# Patient Record
Sex: Male | Born: 1995 | Race: Black or African American | Hispanic: No | Marital: Single | Smoking: Current some day smoker
Health system: Southern US, Community
[De-identification: ages and names within clinical notes are randomized; demographics above are authoritative.]

## PROBLEM LIST (undated history)

## (undated) DIAGNOSIS — F32A Depression, unspecified: Secondary | ICD-10-CM

## (undated) DIAGNOSIS — F909 Attention-deficit hyperactivity disorder, unspecified type: Secondary | ICD-10-CM

---

## 2015-09-22 ENCOUNTER — Emergency Department
Admission: EM | Admit: 2015-09-22 | Discharge: 2015-09-22 | Disposition: A | Discharge status: Home / Self Care | Payer: Self-pay | Attending: Emergency Medicine | Admitting: Emergency Medicine

## 2015-09-22 ENCOUNTER — Encounter: Payer: Self-pay | Admitting: Emergency Medicine

## 2015-09-22 ENCOUNTER — Emergency Department: Payer: Self-pay

## 2015-09-22 DIAGNOSIS — S82402A Unspecified fracture of shaft of left fibula, initial encounter for closed fracture: Secondary | ICD-10-CM

## 2015-09-22 DIAGNOSIS — Y929 Unspecified place or not applicable: Secondary | ICD-10-CM | POA: Insufficient documentation

## 2015-09-22 DIAGNOSIS — F17200 Nicotine dependence, unspecified, uncomplicated: Secondary | ICD-10-CM | POA: Insufficient documentation

## 2015-09-22 DIAGNOSIS — Y9372 Activity, wrestling: Secondary | ICD-10-CM | POA: Insufficient documentation

## 2015-09-22 DIAGNOSIS — W51XXXA Accidental striking against or bumped into by another person, initial encounter: Secondary | ICD-10-CM | POA: Insufficient documentation

## 2015-09-22 DIAGNOSIS — Y999 Unspecified external cause status: Secondary | ICD-10-CM | POA: Insufficient documentation

## 2015-09-22 DIAGNOSIS — S82401A Unspecified fracture of shaft of right fibula, initial encounter for closed fracture: Secondary | ICD-10-CM | POA: Insufficient documentation

## 2015-09-22 MED ORDER — HYDROCODONE-ACETAMINOPHEN 5-325 MG PO TABS: 1.0000 | ORAL_TABLET | ORAL | 0 refills | Status: DC | PRN

## 2015-09-22 MED ORDER — OXYCODONE-ACETAMINOPHEN 5-325 MG PO TABS
1.0000 | ORAL_TABLET | Freq: Once | ORAL | Status: AC
  Administered 2015-09-22: 1 via ORAL
  Filled 2015-09-22: qty 1

## 2015-09-22 MED ORDER — NAPROXEN 500 MG PO TABS: 500.0000 mg | ORAL_TABLET | Freq: Two times a day (BID) | ORAL | 0 refills | Status: AC

## 2015-09-22 NOTE — ED Provider Notes (Signed)
[facility] Emergency Department Provider Note   ____________________________________________   First MD Initiated Contact with Patient 09/22/15 1219     (approximate)  I have reviewed the triage vital signs and the nursing notes.   HISTORY  Chief Complaint Leg Injury    HPI Steven Brock is a 20 y.o. male presents for evaluation of right knee pain. Patient states he was wrestling around with another friend when he landed on his knee. Complains of pain laterally to the right aspect of his leg.   History reviewed. No pertinent past medical history.  There are no active problems to display for this patient.   History reviewed. No pertinent surgical history.  Prior to Admission medications   Medication Sig Start Date End Date Taking? Authorizing Provider  HYDROcodone-acetaminophen (NORCO) 5-325 MG tablet Take 1-2 tablets by mouth every 4 (four) hours as needed for moderate pain. 09/22/15   Evangeline Dakin, PA-C  naproxen (NAPROSYN) 500 MG tablet Take 1 tablet (500 mg total) by mouth 2 (two) times daily with a meal. 09/22/15   Evangeline Dakin, PA-C    Allergies Bee venom  No family history on file.  Social History Social History  Substance Use Topics  . Smoking status: Current Some Day Smoker  . Smokeless tobacco: Never Used  . Alcohol use No     Comment: Occassionally    Review of Systems Constitutional: No fever/chills Cardiovascular: Denies chest pain. Respiratory: Denies shortness of breath. Musculoskeletal: Positive for right knee pain. Skin: Negative for rash. Neurological: Negative for headaches, focal weakness or numbness.  10-point ROS otherwise negative.  ____________________________________________   PHYSICAL EXAM:  VITAL SIGNS: ED Triage Vitals  Enc Vitals Group     BP 09/22/15 1124 124/75     Pulse Rate 09/22/15 1124 63     Resp 09/22/15 1124 18     Temp 09/22/15 1124 97.5 F (36.4 C)     Temp Source 09/22/15  1124 Oral     SpO2 09/22/15 1124 100 %     Weight 09/22/15 1125 115 lb (52.2 kg)     Height 09/22/15 1125 5\' 5"  (1.651 m)     Head Circumference --      Peak Flow --      Pain Score 09/22/15 1125 6     Pain Loc --      Pain Edu? --      Excl. in GC? --     Constitutional: Alert and oriented. Well appearing and in no acute distress.   Cardiovascular: Normal rate, regular rhythm. Grossly normal heart sounds.  Good peripheral circulation. Respiratory: Normal respiratory effort.  No retractions. Lungs CTAB. Musculoskeletal: Positive right knee tenderness lateral aspect. Positive effusion noted to the right knee. Limited range of motion increased pain with extension and flexion and point tenderness to the lateral aspect of the knee. Neurologic:  Normal speech and language. No gross focal neurologic deficits are appreciated. No gait instability. Skin:  Skin is warm, dry and intact. No rash noted. Psychiatric: Mood and affect are normal. Speech and behavior are normal.  ____________________________________________   LABS (all labs ordered are listed, but only abnormal results are displayed)  Labs Reviewed - No data to display ____________________________________________  EKG   ____________________________________________  RADIOLOGY  FINDINGS:  There is an approximately 1 cm fracture fragment in the lateral  right knee approximately 1.7 cm superior to the right fibular head  with surrounding soft tissue swelling, favor an avulsion fracture  fragment from the right fibular head. No additional fracture. No  right knee joint effusion. No right knee dislocation. No suspicious  focal osseous lesion. No radiopaque foreign body.    IMPRESSION:  Avulsion fracture fragment in the lateral right knee as described,  probably arising from the right fibular head.    ____________________________________________   PROCEDURES  Procedure(s) performed: None  Procedures  Critical Care  performed: No    ____________________________________________   INITIAL IMPRESSION / ASSESSMENT AND PLAN / ED COURSE  Pertinent labs & imaging results that were available during my care of the patient were reviewed by me and considered in my medical decision making (see chart for details).   Lateral fibular head fracture. Patient notified placed in a knee immobilizer and crutches no weight bearing until follow-up with orthopedics next week. Rx given for Vicodin 5/325 as needed for pain.  Clinical Course     ____________________________________________   FINAL CLINICAL IMPRESSION(S) / ED DIAGNOSES  Final diagnoses:  Closed left fibular fracture, initial encounter      NEW MEDICATIONS STARTED DURING THIS VISIT:  Discharge Medication List as of 09/22/2015 12:48 PM    START taking these medications   Details  HYDROcodone-acetaminophen (NORCO) 5-325 MG tablet Take 1-2 tablets by mouth every 4 (four) hours as needed for moderate pain., Starting Thu 09/22/2015, Print    naproxen (NAPROSYN) 500 MG tablet Take 1 tablet (500 mg total) by mouth 2 (two) times daily with a meal., Starting Thu 09/22/2015, Print         Note:  This document was prepared using Dragon voice recognition software and may include unintentional dictation errors.   Evangeline Dakinharles M Krithika Tome, PA-C 09/22/15 1555    Myrna Blazeravid Matthew Schaevitz, MD 09/24/15 [phone]

## 2015-09-22 NOTE — ED Triage Notes (Signed)
Pt states was "horseplaying" when another guy fell onto his R leg. Pt presents with some swelling distal to his R knee. Pt maintains ability to move his R foot at this time.

## 2015-09-22 NOTE — ED Notes (Signed)
Pt in via triage with complaints of RLE pain since last night.  Pt reports "wrestling w/ my homeboy, when I picked him up, he came down on my leg."  Pt describes it as if weight of other individual landed on right knee laterally.  Pt to room via wheelchair, reports pain to severe to bear weight.  Pt A/Ox4, no immediate distress noted at this time.

## 2015-09-22 NOTE — Discharge Instructions (Signed)
Do not bear any weight until cleared by orthopedics next week. Call the above orthopedic doctor tomorrow or today and schedule appointment for next week.

## 2019-03-11 ENCOUNTER — Encounter: Payer: Self-pay | Admitting: Emergency Medicine

## 2019-03-11 ENCOUNTER — Other Ambulatory Visit: Payer: Self-pay

## 2019-03-11 ENCOUNTER — Emergency Department
Admission: EM | Admit: 2019-03-11 | Discharge: 2019-03-12 | Disposition: A | Discharge status: Other Acute Inpatient Hospital | Payer: Self-pay | Attending: Emergency Medicine | Admitting: Emergency Medicine

## 2019-03-11 DIAGNOSIS — Z046 Encounter for general psychiatric examination, requested by authority: Secondary | ICD-10-CM | POA: Insufficient documentation

## 2019-03-11 DIAGNOSIS — F203 Undifferentiated schizophrenia: Secondary | ICD-10-CM

## 2019-03-11 DIAGNOSIS — F29 Unspecified psychosis not due to a substance or known physiological condition: Secondary | ICD-10-CM | POA: Insufficient documentation

## 2019-03-11 DIAGNOSIS — F17200 Nicotine dependence, unspecified, uncomplicated: Secondary | ICD-10-CM | POA: Insufficient documentation

## 2019-03-11 DIAGNOSIS — Z20822 Contact with and (suspected) exposure to covid-19: Secondary | ICD-10-CM | POA: Insufficient documentation

## 2019-03-11 DIAGNOSIS — Z79899 Other long term (current) drug therapy: Secondary | ICD-10-CM | POA: Insufficient documentation

## 2019-03-11 DIAGNOSIS — F28 Other psychotic disorder not due to a substance or known physiological condition: Secondary | ICD-10-CM | POA: Insufficient documentation

## 2019-03-11 LAB — CBC
HCT: 43.6 % (ref 39.0–52.0)
Hemoglobin: 15.2 g/dL (ref 13.0–17.0)
MCH: 29.9 pg (ref 26.0–34.0)
MCHC: 34.9 g/dL (ref 30.0–36.0)
MCV: 85.7 fL (ref 80.0–100.0)
Platelets: 276 10*3/uL (ref 150–400)
RBC: 5.09 MIL/uL (ref 4.22–5.81)
RDW: 12.3 % (ref 11.5–15.5)
WBC: 7.3 10*3/uL (ref 4.0–10.5)
nRBC: 0 % (ref 0.0–0.2)

## 2019-03-11 LAB — COMPREHENSIVE METABOLIC PANEL
ALT: 13 U/L (ref 0–44)
AST: 18 U/L (ref 15–41)
Albumin: 4.6 g/dL (ref 3.5–5.0)
Alkaline Phosphatase: 95 U/L (ref 38–126)
Anion gap: 8 (ref 5–15)
BUN: 18 mg/dL (ref 6–20)
CO2: 29 mmol/L (ref 22–32)
Calcium: 9.4 mg/dL (ref 8.9–10.3)
Chloride: 102 mmol/L (ref 98–111)
Creatinine, Ser: 0.96 mg/dL (ref 0.61–1.24)
GFR calc Af Amer: >60 mL/min (ref >60)
GFR calc non Af Amer: >60 mL/min (ref >60)
Glucose, Bld: 101 mg/dL — ABNORMAL HIGH (ref 70–99)
Potassium: 4.2 mmol/L (ref 3.5–5.1)
Sodium: 139 mmol/L (ref 135–145)
Total Bilirubin: 1.1 mg/dL (ref 0.3–1.2)
Total Protein: 8.1 g/dL (ref 6.5–8.1)

## 2019-03-11 LAB — URINE DRUG SCREEN, QUALITATIVE (ARMC ONLY)
Amphetamines, Ur Screen: NOT DETECTED
Barbiturates, Ur Screen: NOT DETECTED
Benzodiazepine, Ur Scrn: NOT DETECTED
Cannabinoid 50 Ng, Ur ~~LOC~~: POSITIVE — AB
Cocaine Metabolite,Ur ~~LOC~~: NOT DETECTED
MDMA (Ecstasy)Ur Screen: NOT DETECTED
Methadone Scn, Ur: NOT DETECTED
Opiate, Ur Screen: NOT DETECTED
Phencyclidine (PCP) Ur S: NOT DETECTED
Tricyclic, Ur Screen: NOT DETECTED

## 2019-03-11 LAB — SALICYLATE LEVEL: Salicylate Lvl: <7 mg/dL — ABNORMAL LOW (ref 7.0–30.0)

## 2019-03-11 LAB — ACETAMINOPHEN LEVEL: Acetaminophen (Tylenol), Serum: <10 ug/mL — ABNORMAL LOW (ref 10–30)

## 2019-03-11 LAB — ETHANOL: Alcohol, Ethyl (B): <10 mg/dL (ref <10)

## 2019-03-11 MED ORDER — LORAZEPAM 1 MG PO TABS
1.0000 mg | ORAL_TABLET | Freq: Once | ORAL | Status: AC
  Administered 2019-03-11: 1 mg via ORAL
  Filled 2019-03-11: qty 1

## 2019-03-11 NOTE — ED Notes (Signed)
Patient came from [facility] under IVC with [employer]. 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 Patient denies SI/HI/AVH. Patient states the police surrounded him with AR15 and he dont know what happened. [employer] states that patient had made comments that he would shoot up the place at a COVID 19 testing place and [employer] was called due to threats.

## 2019-03-11 NOTE — ED Notes (Signed)
Pt was given a cup of ginger ale with ice, no lid or straw given.

## 2019-03-11 NOTE — ED Notes (Signed)

## 2019-03-11 NOTE — ED Notes (Signed)
Pt. Introduced to unit.  Pt. Calm and cooperative.  Pt. Advised of cameras, safety checks and bathroom usage.  Pt. Requested and was given drink.  Pt. Has no questions or concerns at this time, but pt. Agreed to come to this nurse for any information.

## 2019-03-11 NOTE — ED Notes (Signed)
Pt dressed out in wine colored scrubs. Pt belonging: neck gater, two shirts, jogging pants, sneakers, socks, boxer shorts, wallet, bag of pretzels, and small water bottle.

## 2019-03-11 NOTE — BH Assessment (Signed)
Referral information for Psychiatric Hospitalization faxed to;   [facility] [phone]),   . [facility] [phone]),   . [facility] [phone] -or- [phone]),   . [facility] [phone])  . [facility] [phone])

## 2019-03-11 NOTE — BH Assessment (Signed)
Assessment Note  Steven Brock is an 24 y.o. male presenting to [facility] ED under IVC given by [employer] police. Per [employer] police patient had made violent threats, to get a riffle and start shooting, patient had made the comments while in his backyard of his home that was near a vaccine clinic. Officers reported that patient was expressing getting calls from the government and that government was watching him. [place] police then transported patient to [facility] for further evaluation. Per [facility] clinician, patient's mother Steven Brock [phone]) was contacted. Per patient's mother patient's behaviors have been happening for a few days, mother reported that patient had expressed that everyone is out to get him, the FBI is watching him, the FBI is following him and has been screaming at night. Mother reported that that patient indicated that someone wrote a statement that he killed someone and also reported that patient went towards a vaccine clinic and said that he was going to shoot the place up with a AK 47 because they are spying on him.   During patient's assessment with psychiatric team tonight patient was alert and oriented X4, pleasant and cooperative. When asked why patient was presenting to ED patient reported "I don't know, I just had a long day today." When asked by the Psyc NP if patient had made any threats to anyone today patient was unaware "I don't know what happened." When Psyc NP tried to educate patient on mental health and things he may have experienced patient reported "I don't know the meaning of mental health." Patient was unable to report if he has been having AH or VH. Patient was able to deny current SI and reported a past suicide attempt "2-3 years ago, I didn't go to the hospital though." Patient then reported "people wrote fake statements on me, I just want to get justice for it." Patient currently lives with his mother and 2 siblings and reported his sleep as "my brain does that, I can't  control it." Patient was able to report that he smokes marijuana but was unable to recall the last time he used "I don't know, before all this shit happened." UDS was positive for Cannabinoids.   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 No pertinent past medical history.  History reviewed. No pertinent surgical history.  Family History: No family history on file.  Social History:  reports that he has been smoking. He has never used smokeless tobacco. He reports current drug use. Drug: Marijuana. He reports that he does not drink alcohol.  Additional Social History:  Alcohol / Drug Use Pain Medications: See MAR Prescriptions: See MAR Over the Counter: See MAR History of alcohol / drug use?: Yes Substance #1 Name of Substance 1: Marijuana  CIWA: CIWA-Ar BP: (!) 127/95 Pulse Rate: 76 COWS:    Allergies:  Allergies  Allergen Reactions  . Bee Venom Anaphylaxis    Home Medications: (Not in a hospital admission)   OB/GYN Status:  No LMP for male patient.  General Assessment Data Location of Assessment: [facility] ED TTS Assessment: In system Is this a Tele or Face-to-Face Assessment?: Face-to-Face Is this an Initial Assessment or a Re-assessment for this encounter?: Initial Assessment Patient Accompanied by:: N/A Language Other than English: No Living Arrangements: Other (Comment)(Private Residence) What gender do you identify as?: Male Marital status: Single Living Arrangements: Parent, Other relatives Can pt return to current living  arrangement?: (Unknown at this time) Admission Status: Involuntary Petitioner: Police Is patient capable of signing voluntary admission?: No Referral Source: Other Insurance type: None  Medical Screening Exam [facility] Walk-in ONLY) Medical Exam completed: Yes  Crisis Care Plan Living  Arrangements: Parent, Other relatives Legal Guardian: Other:(Self) Name of Psychiatrist: None Name of Therapist: None  Education Status Is patient currently in school?: No Is the patient employed, unemployed or receiving disability?: Unemployed  Risk to self with the past 6 months Suicidal Ideation: No Has patient been a risk to self within the past 6 months prior to admission? : No Suicidal Intent: No Has patient had any suicidal intent within the past 6 months prior to admission? : No Is patient at risk for suicide?: No Suicidal Plan?: No Has patient had any suicidal plan within the past 6 months prior to admission? : No Access to Means: No What has been your use of drugs/alcohol within the last 12 months?: Marijuana Previous Attempts/Gestures: Yes How many times?: 1 Triggers for Past Attempts: Family contact Intentional Self Injurious Behavior: None Family Suicide History: Unknown Recent stressful life event(s): Conflict (Comment), Other (Comment)(Conflict with family, Delusions) Persecutory voices/beliefs?: Yes Depression: Yes Depression Symptoms: Insomnia, Loss of interest in usual pleasures Substance abuse history and/or treatment for substance abuse?: No Suicide prevention information given to non-admitted patients: Not applicable  Risk to Others within the past 6 months Homicidal Ideation: No-Not Currently/Within Last 6 Months Does patient have any lifetime risk of violence toward others beyond the six months prior to admission? : No Thoughts of Harm to Others: No-Not Currently Present/Within Last 6 Months Current Homicidal Intent: No-Not Currently/Within Last 6 Months Current Homicidal Plan: No-Not Currently/Within Last 6 Months Access to Homicidal Means: No Identified Victim: threatened to harm a vacinne clinic History of harm to others?: No Assessment of Violence: None Noted Violent Behavior Description: None Does patient have access to weapons?: No Criminal  Charges Pending?: No Does patient have a court date: No Is patient on probation?: No  Psychosis Hallucinations: Auditory Delusions: Persecutory  Mental Status Report Appearance/Hygiene: In scrubs Eye Contact: Good Motor Activity: Freedom of movement Speech: Logical/coherent Level of Consciousness: Alert Mood: Suspicious, Pleasant Affect: Apprehensive, Flat Anxiety Level: Minimal Thought Processes: Coherent Judgement: Unimpaired Orientation: Person, Place, Time, Situation, Appropriate for developmental age Obsessive Compulsive Thoughts/Behaviors: None  Cognitive Functioning Concentration: Normal Memory: Recent Intact, Remote Impaired Is patient IDD: No Insight: Poor Impulse Control: Fair Appetite: Good Have you had any weight changes? : No Change Sleep: Decreased Total Hours of Sleep: 4 Vegetative Symptoms: None  ADLScreening [facility] Assessment Services) Patient's cognitive ability adequate to safely complete daily activities?: Yes Patient able to express need for assistance with ADLs?: Yes Independently performs ADLs?: Yes (appropriate for developmental age)  Prior Inpatient Therapy Prior Inpatient Therapy: No  Prior Outpatient Therapy Prior Outpatient Therapy: No Does patient have an ACCT team?: No Does patient have Intensive In-House Services?  : No Does patient have [facility] services? : No Does patient have P4CC services?: No  ADL Screening (condition at time of admission) Patient's cognitive ability adequate to safely complete daily activities?: Yes Is the patient deaf or have difficulty hearing?: No Does the patient have difficulty seeing, even when wearing glasses/contacts?: No Does the patient have difficulty concentrating, remembering, or making decisions?: No Patient able to express need for assistance with ADLs?: Yes Does the patient have difficulty dressing or bathing?: No Independently performs ADLs?: Yes (appropriate for developmental age) Does the  patient have difficulty walking or  climbing stairs?: No Weakness of Legs: None Weakness of Arms/Hands: None  Home Assistive Devices/Equipment Home Assistive Devices/Equipment: None  Therapy Consults (therapy consults require a physician order) PT Evaluation Needed: No OT Evalulation Needed: No SLP Evaluation Needed: No Abuse/Neglect Assessment (Assessment to be complete while patient is alone) Abuse/Neglect Assessment Can Be Completed: Yes Physical Abuse: Denies Verbal Abuse: Denies Sexual Abuse: Denies Exploitation of patient/patient's resources: Denies Self-Neglect: Denies Values / Beliefs Cultural Requests During Hospitalization: None Spiritual Requests During Hospitalization: None Consults Spiritual Care Consult Needed: No Transition of Care Team Consult Needed: No Advance Directives (For Healthcare) Does Patient Have a Medical Advance Directive?: No Would patient like information on creating a medical advance directive?: No - Patient declined          Disposition: Per Psyc NP patient is recommended for Inpatient Hospitalization Disposition Initial Assessment Completed for this Encounter: Yes  On Site Evaluation by:   Reviewed with Physician:    Leonie Douglas MS [place] 03/11/2019 10:15 PM
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
## 2019-03-11 NOTE — ED Provider Notes (Signed)
[facility] Emergency Department Provider Note   ____________________________________________   I have reviewed the triage vital signs and the nursing notes.   HISTORY  Chief Complaint IVC   History limited by: Psychiatric illness   HPI Steven Brock is a 24 y.o. male who presents to the emergency department today from [place] under IVC.  Patient himself is unsure why he is here.  He  does state that he was talking about getting his AR 15 but says that is only because it was his second amendment right.  The patient talks about "they" writing false statements about him although it is unclear who he meant by the term they. The patient says that he does not feel normal but it is hard for him to describe what he means by that. Per the paperwork from [place] the patient has been making threats and has been given orders by the government.    Records reviewed. Per medical record review patient has a history of fibular fracture.  History reviewed. No pertinent past medical history.  There are no problems to display for this patient.   History reviewed. No pertinent surgical history.  Prior to Admission medications   Medication Sig Start Date End Date Taking? Authorizing Provider  HYDROcodone-acetaminophen (NORCO) 5-325 MG tablet Take 1-2 tablets by mouth every 4 (four) hours as needed for moderate pain. 09/22/15   Beers, Pierce Crane, PA-C  naproxen (NAPROSYN) 500 MG tablet Take 1 tablet (500 mg total) by mouth 2 (two) times daily with a meal. 09/22/15   Beers, Pierce Crane, PA-C    Allergies Bee venom  No family history on file.  Social History Social History   Tobacco Use  . Smoking status: Current Some Day Smoker  . Smokeless tobacco: Never Used  Substance Use Topics  . Alcohol use: No  . Drug use: Yes    Types: Marijuana    Review of Systems Constitutional: No fever/chills Eyes: No visual changes. ENT: No sore throat. Cardiovascular: Denies chest  pain. Respiratory: Denies shortness of breath. Gastrointestinal: No abdominal pain.  No nausea, no vomiting.  No diarrhea.   Genitourinary: Negative for dysuria. Musculoskeletal: Negative for back pain. Skin: Negative for rash. Neurological: Negative for headaches, focal weakness or numbness.  ____________________________________________   PHYSICAL EXAM:  VITAL SIGNS: ED Triage Vitals [03/11/19 1911]  Enc Vitals Group     BP (!) 127/95     Pulse Rate 76     Resp 16     Temp      Temp src      SpO2 100 %     Weight 120 lb (54.4 kg)     Height 5\' 4"  (1.626 m)     Head Circumference      Peak Flow      Pain Score 0   Constitutional: Alert and oriented.  Eyes: Conjunctivae are normal.  ENT      Head: Normocephalic and atraumatic.      Nose: No congestion/rhinnorhea.      Mouth/Throat: Mucous membranes are moist.      Neck: No stridor. Hematological/Lymphatic/Immunilogical: No cervical lymphadenopathy. Cardiovascular: Normal rate, regular rhythm.  No murmurs, rubs, or gallops.  Respiratory: Normal respiratory effort without tachypnea nor retractions. Breath sounds are clear and equal bilaterally. No wheezes/rales/rhonchi. Gastrointestinal: Soft and non tender. No rebound. No guarding.  Genitourinary: Deferred Musculoskeletal: Normal range of motion in all extremities. No lower extremity edema. Neurologic:  Normal speech and language. No gross focal neurologic deficits  are appreciated.  Skin:  Skin is warm, dry and intact. No rash noted. Psychiatric: Delusional  ____________________________________________    LABS (pertinent positives/negatives)  CBC wbc 7.3, hgb 15.2, plt 276 CMP wnl except glu 101 Ethanol <10  ____________________________________________   EKG  None  ____________________________________________     RADIOLOGY  None  ____________________________________________   PROCEDURES  Procedures  ____________________________________________   INITIAL IMPRESSION / ASSESSMENT AND PLAN / ED COURSE  Pertinent labs & imaging results that were available during my care of the patient were reviewed by me and considered in my medical decision making (see chart for details).   Patient presents to the emergency department under IVC. Patient expresses delusions on my exam. At this point will continue IVC and have psychiatry evaluate.   ___________________________________________   FINAL CLINICAL IMPRESSION(S) / ED DIAGNOSES  Final diagnoses:  Psychosis, unspecified psychosis type (HCC)     Note: This dictation was prepared with Dragon dictation. Any transcriptional errors that result from this process are unintentional     Phineas Semen, MD 03/12/19 1537

## 2019-03-12 LAB — RESPIRATORY PANEL BY RT PCR (FLU A&B, COVID)
Influenza A by PCR: NEGATIVE
Influenza B by PCR: NEGATIVE
SARS Coronavirus 2 by RT PCR: NEGATIVE

## 2019-03-12 NOTE — ED Notes (Addendum)
Patient mistakenly given the wrong belonging  bag when picked up by sheriff to transport to [facility]. Call received from facility nurse doing intake. Reports that bag did not to patient will hold bag until someone picks up this belongings bag. Patient sister Andris Flurry called [phone] aware of the incident. Will come pick up Isaihs belongings tomorrow.

## 2019-03-12 NOTE — BH Assessment (Signed)
PATIENT BED AVAILABLE AFTER 10AM  Patient has been accepted to [facility].  Patient assigned to [facility] C-Unit Accepting physician is Dr. Sallyanne Kuster.  Call report to [phone].  Representative was [place].   ER Staff is aware of it:  [place] ER Secretary  Dr. Roxan Hockey, ER MD  [facility] Patient's Nurse     Address:  [address] [place] [address] Patient must check-in at the [facility] for COVID screening

## 2019-03-12 NOTE — ED Notes (Signed)
Pt given breakfast tray

## 2019-03-12 NOTE — ED Notes (Signed)
Returned call to [employer] and gave report on patient.  [facility] said they had room available.

## 2019-03-12 NOTE — ED Notes (Signed)
Assumed care of patient. Patient sleeping comfortably, as per prior nurse patient slept most of the night. Calm and cooperative when up. Awaiting transfer to [facility] this morning. Safety maintained. Will monitor.

## 2019-04-11 ENCOUNTER — Emergency Department: Payer: Self-pay

## 2019-04-11 ENCOUNTER — Other Ambulatory Visit: Payer: Self-pay

## 2019-04-11 ENCOUNTER — Encounter: Payer: Self-pay | Admitting: Emergency Medicine

## 2019-04-11 DIAGNOSIS — Z79899 Other long term (current) drug therapy: Secondary | ICD-10-CM | POA: Insufficient documentation

## 2019-04-11 DIAGNOSIS — Y939 Activity, unspecified: Secondary | ICD-10-CM | POA: Insufficient documentation

## 2019-04-11 DIAGNOSIS — F17200 Nicotine dependence, unspecified, uncomplicated: Secondary | ICD-10-CM | POA: Insufficient documentation

## 2019-04-11 DIAGNOSIS — S022XXA Fracture of nasal bones, initial encounter for closed fracture: Secondary | ICD-10-CM | POA: Insufficient documentation

## 2019-04-11 DIAGNOSIS — Y929 Unspecified place or not applicable: Secondary | ICD-10-CM | POA: Insufficient documentation

## 2019-04-11 DIAGNOSIS — Y999 Unspecified external cause status: Secondary | ICD-10-CM | POA: Insufficient documentation
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
## 2019-04-11 NOTE — ED Notes (Signed)
Pt agreeing to speak with brother at this time and talking to him on the phone

## 2019-04-12 ENCOUNTER — Emergency Department
Admission: EM | Admit: 2019-04-12 | Discharge: 2019-04-12 | Disposition: A | Discharge status: Home / Self Care | Payer: Self-pay | Attending: Emergency Medicine | Admitting: Emergency Medicine

## 2019-04-12 DIAGNOSIS — S022XXA Fracture of nasal bones, initial encounter for closed fracture: Secondary | ICD-10-CM

## 2019-04-12 MED ORDER — HYDROCODONE-ACETAMINOPHEN 5-325 MG PO TABS: 1.0000 | ORAL_TABLET | Freq: Four times a day (QID) | ORAL | 0 refills | Status: AC | PRN

## 2019-04-12 MED ORDER — HYDROCODONE-ACETAMINOPHEN 5-325 MG PO TABS
1.0000 | ORAL_TABLET | Freq: Once | ORAL | Status: AC
  Administered 2019-04-12: 1 via ORAL
  Filled 2019-04-12: qty 1

## 2019-04-12 MED ORDER — IBUPROFEN 600 MG PO TABS: 600.0000 mg | ORAL_TABLET | Freq: Three times a day (TID) | ORAL | 0 refills | Status: AC | PRN

## 2019-04-12 MED ORDER — CEPHALEXIN 500 MG PO CAPS: 500.0000 mg | ORAL_CAPSULE | Freq: Three times a day (TID) | ORAL | 0 refills | Status: DC

## 2019-04-12 MED ORDER — IBUPROFEN 400 MG PO TABS
600.0000 mg | ORAL_TABLET | Freq: Once | ORAL | Status: AC
  Administered 2019-04-12: 600 mg via ORAL
  Filled 2019-04-12: qty 2

## 2019-04-12 MED ORDER — CEPHALEXIN 500 MG PO CAPS
500.0000 mg | ORAL_CAPSULE | Freq: Once | ORAL | Status: AC
  Administered 2019-04-12: 02:00:00 500 mg via ORAL
  Filled 2019-04-12: qty 1
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
## 2019-04-12 NOTE — Discharge Instructions (Signed)
1.  Take antibiotic as prescribed (Keflex 500 mg 3 times daily x7 days). 2.  You may take pain medicines as needed (Motrin/Norco #15). 3.  Apply ice several times daily to decrease swelling. 4.  Return to the ER for worsening symptoms, persistent vomiting, difficulty breathing, lethargy or other concerns.

## 2019-04-19 ENCOUNTER — Encounter: Payer: Self-pay | Admitting: Emergency Medicine

## 2019-04-19 ENCOUNTER — Other Ambulatory Visit: Payer: Self-pay

## 2019-04-19 ENCOUNTER — Emergency Department
Admission: EM | Admit: 2019-04-19 | Discharge: 2019-04-19 | Disposition: A | Discharge status: Home / Self Care | Payer: Self-pay | Attending: Emergency Medicine | Admitting: Emergency Medicine

## 2019-04-19 DIAGNOSIS — J3489 Other specified disorders of nose and nasal sinuses: Secondary | ICD-10-CM | POA: Insufficient documentation

## 2019-04-19 DIAGNOSIS — F17210 Nicotine dependence, cigarettes, uncomplicated: Secondary | ICD-10-CM | POA: Insufficient documentation

## 2019-04-19 DIAGNOSIS — Z79899 Other long term (current) drug therapy: Secondary | ICD-10-CM | POA: Insufficient documentation

## 2019-04-19 MED ORDER — KETOROLAC TROMETHAMINE 10 MG PO TABS: 10.0000 mg | ORAL_TABLET | Freq: Four times a day (QID) | ORAL | 0 refills | Status: AC | PRN

## 2019-04-19 MED ORDER — KETOROLAC TROMETHAMINE 30 MG/ML IJ SOLN
30.0000 mg | Freq: Once | INTRAMUSCULAR | Status: AC
  Administered 2019-04-19: 30 mg via INTRAMUSCULAR
  Filled 2019-04-19: qty 1
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
## 2019-04-19 NOTE — ED Provider Notes (Signed)
Emergency Department Provider Note  ____________________________________________  Time seen: Approximately 3:07 PM  I have reviewed the triage vital signs and the nursing notes.   HISTORY  Chief Complaint nose pain   Historian Patient     HPI Steven Brock is a 24 y.o. male presents to the emergency department with concern for persistent nose pain.  Patient was seen and evaluated on April 11 and was diagnosed with a depressed nasal bone fracture.  Patient has not followed up with ENT as directed and has been taking his antibiotics.  Patient states that his pain has persisted and he is unsure what to take for pain.  No fever or chills at home.  No other alleviating measures have been attempted.   History reviewed. No pertinent past medical history.   Immunizations up to date:  Yes.     History reviewed. No pertinent past medical history.  Patient Active Problem List   Diagnosis Date Noted  . Schizophrenia, undifferentiated ([place]) 03/11/2019    History reviewed. No pertinent surgical history.  Prior to Admission medications   Medication Sig Start Date End Date Taking? Authorizing Provider  cephALEXin (KEFLEX) 500 MG capsule Take 1 capsule (500 mg total) by mouth 3 (three) times daily. 04/12/19   Paulette Blanch, MD  HYDROcodone-acetaminophen (NORCO) 5-325 MG tablet Take 1 tablet by mouth every 6 (six) hours as needed for moderate pain. 04/12/19   Paulette Blanch, MD  ibuprofen (ADVIL) 600 MG tablet Take 1 tablet (600 mg total) by mouth every 8 (eight) hours as needed. 04/12/19   Paulette Blanch, MD  ketorolac (TORADOL) 10 MG tablet Take 1 tablet (10 mg total) by mouth every 6 (six) hours as needed for up to 5 days. 04/19/19 04/24/19  Lannie Fields, PA-C  naproxen (NAPROSYN) 500 MG tablet Take 1 tablet (500 mg total) by mouth 2 (two) times daily with a meal. 09/22/15   Beers, Pierce Crane, PA-C    Allergies Bee venom  History reviewed. No pertinent family history.  Social  History Social History   Tobacco Use  . Smoking status: Current Some Day Smoker  . Smokeless tobacco: Never Used  Substance Use Topics  . Alcohol use: No  . Drug use: Yes    Types: Marijuana     Review of Systems  Constitutional: No fever/chills Eyes:  No discharge ENT: Patient has nose pain.  Respiratory: no cough. No SOB/ use of accessory muscles to breath Gastrointestinal:   No nausea, no vomiting.  No diarrhea.  No constipation. Musculoskeletal: Negative for musculoskeletal pain. Skin: Negative for rash, abrasions, lacerations, ecchymosis.    ____________________________________________   PHYSICAL EXAM:  VITAL SIGNS: ED Triage Vitals  Enc Vitals Group     BP 04/19/19 1439 121/77     Pulse Rate 04/19/19 1439 (!) 102     Resp 04/19/19 1439 18     Temp 04/19/19 1439 98.7 F (37.1 C)     Temp Source 04/19/19 1439 Oral     SpO2 04/19/19 1439 97 %     Weight 04/19/19 1433 110 lb (49.9 kg)     Height 04/19/19 1433 5\' 3"  (1.6 m)     Head Circumference --      Peak Flow --      Pain Score 04/19/19 1433 10     Pain Loc --      Pain Edu? --      Excl. in [place]? --      Constitutional: Alert and oriented.  Well appearing and in no acute distress. Eyes: Conjunctivae are normal. PERRL. EOMI. Head: Atraumatic. ENT:      Nose: No congestion/rhinnorhea. No deformities visualized. No epistaxis.       Mouth/Throat: Mucous membranes are moist.  Neck: No stridor.  No cervical spine tenderness to palpation. Cardiovascular: Normal rate, regular rhythm. Normal S1 and S2.  Good peripheral circulation. Respiratory: Normal respiratory effort without tachypnea or retractions. Lungs CTAB. Good air entry to the bases with no decreased or absent breath sounds Skin:  Skin is warm, dry and intact. No rash noted. Psychiatric: Mood and affect are normal for age. Speech and behavior are normal.   ____________________________________________   LABS (all labs ordered are listed, but only  abnormal results are displayed)  Labs Reviewed - No data to display ____________________________________________  EKG   ____________________________________________  RADIOLOGY   No results found.  ____________________________________________    PROCEDURES  Procedure(s) performed:     Procedures     Medications  ketorolac (TORADOL) 30 MG/ML injection 30 mg (30 mg Intramuscular Given 04/19/19 1509)     ____________________________________________   INITIAL IMPRESSION / ASSESSMENT AND PLAN / ED COURSE  Pertinent labs & imaging results that were available during my care of the patient were reviewed by me and considered in my medical decision making (see chart for details).      Assessment and plan Nasal bone fracture Persistent pain 24 year old male presents to the emergency department with persistent nasal pain.  He has not followed up with ENT.  Patient was given another referral to otolaryngology during this emergency department encounter.  He was discharged with Toradol and received IM Toradol in the emergency department.  Return precautions were given to return with new or worsening symptoms.  All patient questions were answered.   ____________________________________________  FINAL CLINICAL IMPRESSION(S) / ED DIAGNOSES  Final diagnoses:  Nasal pain      NEW MEDICATIONS STARTED DURING THIS VISIT:  ED Discharge Orders         Ordered    ketorolac (TORADOL) 10 MG tablet  Every 6 hours PRN     04/19/19 1510              This chart was dictated using voice recognition software/Dragon. Despite best efforts to proofread, errors can occur which can change the meaning. Any change was purely unintentional.     Orvil Feil, PA-C 04/19/19 1534    Sharman Cheek, MD 04/21/19 1451

## 2019-04-19 NOTE — ED Triage Notes (Addendum)
RN taking vitals and pt states "I am having a panic attack, how am I going to tell my mama I have an infection".  RN informed patient he may have been given those to prevent infection and that he is 24 years old and can chose what he would like to tell his mama.

## 2019-04-19 NOTE — ED Notes (Signed)
Pt given phone to call his mother for a ride home.

## 2019-08-25 ENCOUNTER — Emergency Department: Admission: EM | Admit: 2019-08-25 | Discharge: 2019-08-25 | Discharge status: Against Medical Advice | Payer: Self-pay

## 2019-08-25 ENCOUNTER — Emergency Department
Admission: EM | Admit: 2019-08-25 | Discharge: 2019-08-26 | Disposition: A | Discharge status: Home / Self Care | Payer: Self-pay | Attending: Emergency Medicine | Admitting: Emergency Medicine

## 2019-08-25 ENCOUNTER — Other Ambulatory Visit: Payer: Self-pay

## 2019-08-25 DIAGNOSIS — E162 Hypoglycemia, unspecified: Secondary | ICD-10-CM | POA: Insufficient documentation

## 2019-08-25 DIAGNOSIS — F17210 Nicotine dependence, cigarettes, uncomplicated: Secondary | ICD-10-CM | POA: Insufficient documentation

## 2019-08-25 DIAGNOSIS — R404 Transient alteration of awareness: Secondary | ICD-10-CM

## 2019-08-25 LAB — COMPREHENSIVE METABOLIC PANEL
ALT: 14 U/L (ref 0–44)
AST: 18 U/L (ref 15–41)
Albumin: 4.1 g/dL (ref 3.5–5.0)
Alkaline Phosphatase: 107 U/L (ref 38–126)
Anion gap: 11 (ref 5–15)
BUN: 9 mg/dL (ref 6–20)
CO2: 23 mmol/L (ref 22–32)
Calcium: 8.7 mg/dL — ABNORMAL LOW (ref 8.9–10.3)
Chloride: 104 mmol/L (ref 98–111)
Creatinine, Ser: 0.89 mg/dL (ref 0.61–1.24)
GFR calc Af Amer: >60 mL/min (ref >60)
GFR calc non Af Amer: >60 mL/min (ref >60)
Glucose, Bld: 63 mg/dL — ABNORMAL LOW (ref 70–99)
Potassium: 3.6 mmol/L (ref 3.5–5.1)
Sodium: 138 mmol/L (ref 135–145)
Total Bilirubin: 1.2 mg/dL (ref 0.3–1.2)
Total Protein: 7.4 g/dL (ref 6.5–8.1)

## 2019-08-25 LAB — CBC
HCT: 42.6 % (ref 39.0–52.0)
Hemoglobin: 14.6 g/dL (ref 13.0–17.0)
MCH: 30 pg (ref 26.0–34.0)
MCHC: 34.3 g/dL (ref 30.0–36.0)
MCV: 87.5 fL (ref 80.0–100.0)
Platelets: 282 10*3/uL (ref 150–400)
RBC: 4.87 MIL/uL (ref 4.22–5.81)
RDW: 12.1 % (ref 11.5–15.5)
WBC: 7.7 10*3/uL (ref 4.0–10.5)
nRBC: 0 % (ref 0.0–0.2)

## 2019-08-25 LAB — ETHANOL: Alcohol, Ethyl (B): <10 mg/dL (ref <10)

## 2019-08-25 LAB — GLUCOSE, CAPILLARY: Glucose-Capillary: 72 mg/dL (ref 70–99)
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
## 2019-08-25 NOTE — Discharge Instructions (Addendum)
Your workup in the Emergency Department today was reassuring.  We did not find any specific abnormalities other than your blood sugar (glucose) being a little bit low.  Please read through the included information and try to eat and drink more, particularly while you are working.  In general, we recommend you drink plenty of fluids, take your regular medications and/or any new ones prescribed today, and follow up with the doctor(s) listed in these documents as recommended.  Return to the Emergency Department if you develop new or worsening symptoms that concern you.

## 2019-08-25 NOTE — ED Provider Notes (Signed)
[facility] Emergency Department Provider Note  ____________________________________________   First MD Initiated Contact with Patient 08/25/19 2307     (approximate)  I have reviewed the triage vital signs and the nursing notes.   HISTORY  Chief Complaint Altered Mental Status  Level 5 caveat: The patient is uncooperative and will not provide any history.  HPI Steven Brock is a 24 y.o. male with medical history as listed below who presents by EMS  for being unresponsive at work.  No other details are available.  He was responsive to ammonia inhalants but will not talk with EMS or ED staff.  Apparently he was brought in yesterday for a similar situation but left without being seen.  The patient is awake although seems to be trying to sleep.  He responds to painful stimuli and is slightly nodding his head yes and shaking his head no when asked simple questions like "did you come from work" and "why did they send you here".  However he will not speak or answer any other questions and seems to be making a point to ignore any interaction.  He will scratch his arm and his hand and look down at his left hand when I am attempting to talk with him but will not look me in the eye or engaging in conversation.  He shakes his head "no" slightly when asked if he is in any pain.        No past medical history on file.  Patient Active Problem List   Diagnosis Date Noted  . 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  Prior to Admission medications   Medication Sig Start Date End Date Taking? Authorizing Provider  cephALEXin (KEFLEX) 500 MG capsule Take 1 capsule (500 mg total) by mouth 3 (three) times daily. 04/12/19   Irean Hong, MD  HYDROcodone-acetaminophen (NORCO) 5-325 MG tablet Take 1 tablet by mouth every 6 (six) hours as needed for moderate pain. 04/12/19   Irean Hong, MD  ibuprofen (ADVIL) 600 MG tablet Take 1  tablet (600 mg total) by mouth every 8 (eight) hours as needed. 04/12/19   Irean Hong, MD  naproxen (NAPROSYN) 500 MG tablet Take 1 tablet (500 mg total) by mouth 2 (two) times daily with a meal. 09/22/15   Beers, Charmayne Sheer, PA-C    Allergies Bee venom  No family history on file.  Social History Social History   Tobacco Use  . Smoking status: Current Some Day Smoker  . Smokeless tobacco: Never Used  Vaping Use  . Vaping Use: Never assessed  Substance Use Topics  . Alcohol use: No  . Drug use: Yes    Types: Marijuana    Review of Systems Level 5 caveat: The patient is uncooperative and will not provide any history.   ____________________________________________   PHYSICAL EXAM:  VITAL SIGNS: ED Triage Vitals  Enc Vitals Group     BP 08/25/19 2315 139/78     Pulse Rate 08/25/19 2315 67     Resp 08/25/19 2315 18     Temp --      Temp src --      SpO2 08/25/19 2315 100 %     Weight 08/25/19 2257 49.9 kg (110 lb 0.2 oz)     Height --      Head Circumference --      Peak Flow --      Pain Score --  Pain Loc --      Pain Edu? --      Excl. in GC? --     Constitutional: The patient is awake and minimally cooperative but in no apparent distress. Eyes: Conjunctivae are normal.  Head: Atraumatic. Nose: No congestion/rhinnorhea. Mouth/Throat: Patient is wearing a mask. Neck: No stridor.  No meningeal signs.   Cardiovascular: Normal rate, regular rhythm. Good peripheral circulation. Grossly normal heart sounds. Respiratory: Normal respiratory effort.  No retractions. Gastrointestinal: Soft and nontender. No distention.  Musculoskeletal: No lower extremity tenderness nor edema. No gross deformities of extremities. Neurologic: Patient will not cooperate with neurologic exam but is moving all 4 extremities and is able to move and adjust himself in the bed and turn away from people talking to him. Skin:  Skin is warm, dry and intact. Psychiatric: Mood and affect is  quiet, withdrawn, and minimally interactive.  However he is not violent or threatening, just uncooperative.  ____________________________________________   LABS (all labs ordered are listed, but only abnormal results are displayed)  Labs Reviewed  COMPREHENSIVE METABOLIC PANEL - Abnormal; Notable for the following components:      Result Value   Glucose, Bld 63 (*)    Calcium 8.7 (*)    All other components within normal limits  CBC  ETHANOL  GLUCOSE, CAPILLARY  URINALYSIS, COMPLETE (UACMP) WITH MICROSCOPIC  URINE DRUG SCREEN, QUALITATIVE ([facility] ONLY)  CBG MONITORING, ED   ____________________________________________  EKG  No indication for emergent EKG ____________________________________________  RADIOLOGY I, Loleta Rose, personally viewed and evaluated these images (plain radiographs) as part of my medical decision making, as well as reviewing the written report by the radiologist.  ED MD interpretation: No indication for emergent imaging  Official radiology report(s): No results found.  ____________________________________________   PROCEDURES   Procedure(s) performed (including Critical Care):  Procedures   ____________________________________________   INITIAL IMPRESSION / MDM / ASSESSMENT AND PLAN / ED COURSE  As part of my medical decision making, I reviewed the following data within the electronic MEDICAL RECORD NUMBER Nursing notes reviewed and incorporated, Labs reviewed , Old chart reviewed, Notes from prior ED visits and [place] Controlled Substance Database   Differential diagnosis includes, but is not limited to, exhaustion, dehydration, psychiatric illness, medication or drug side effect, acute neurological issue, malingering.  I gave the patient multiple opportunities to speak with me and to provide any additional history and he made it clear that he did not want to talk.  From an emergency perspective, the patient has normal and stable vital signs, is  protecting his airway, moving all 4 extremities, has no signs of trauma, and has a negative alcohol level, normal comprehensive metabolic panel other than mild hyperglycemia, and a normal CBC.  I am going to give him something to eat and will discharge him.  He is in no distress and does not require additional evaluation or treatment.          ____________________________________________  FINAL CLINICAL IMPRESSION(S) / ED DIAGNOSES  Final diagnoses:  Transient alteration of awareness  Hypoglycemia     MEDICATIONS GIVEN DURING THIS VISIT:  Medications - No data to display   ED Discharge Orders    None      *Please note:  SADIE PICKAR was evaluated in Emergency Department on 08/25/2019 for the symptoms described in the history of present illness. He was evaluated in the context of the global COVID-19 pandemic, which necessitated consideration that the patient might be at risk for infection  with the SARS-CoV-2 virus that causes COVID-19. Institutional protocols and algorithms that pertain to the evaluation of patients at risk for COVID-19 are in a state of rapid change based on information released by regulatory bodies including the CDC and federal and state organizations. These policies and algorithms were followed during the patient's care in the ED.  Some ED evaluations and interventions may be delayed as a result of limited staffing during and after the pandemic.*  Note:  This document was prepared using Dragon voice recognition software and may include unintentional dictation errors.   Loleta Rose, MD 08/25/19 2348

## 2019-08-25 NOTE — ED Notes (Signed)
Patient BIB EMS. He refused to speak or communicate with staff. He is sleeping calmly. Was able to draw blood and blood glucose check. VS and BG are stable. Will continue to monitor.

## 2019-08-26 NOTE — ED Notes (Signed)
Patient woke up and went to the bathroom. He asked for his phone and wallet. He was able to find his wallet but not the phone. He said he will call his phone. No issues.

## 2019-08-26 NOTE — ED Notes (Signed)
Patient is discharged to home. He is stable in NAD. Discharge instruction reviewed and work note provided. No issues.
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
## 2023-01-01 ENCOUNTER — Emergency Department
Admission: EM | Admit: 2023-01-01 | Discharge: 2023-01-01 | Disposition: A | Discharge status: Home / Self Care | Payer: Self-pay | Attending: Emergency Medicine | Admitting: Emergency Medicine

## 2023-01-01 ENCOUNTER — Other Ambulatory Visit: Payer: Self-pay

## 2023-01-01 DIAGNOSIS — S61412A Laceration without foreign body of left hand, initial encounter: Secondary | ICD-10-CM | POA: Insufficient documentation

## 2023-01-01 DIAGNOSIS — Z008 Encounter for other general examination: Secondary | ICD-10-CM

## 2023-01-01 DIAGNOSIS — Y9339 Activity, other involving climbing, rappelling and jumping off: Secondary | ICD-10-CM | POA: Insufficient documentation

## 2023-01-01 DIAGNOSIS — W269XXA Contact with unspecified sharp object(s), initial encounter: Secondary | ICD-10-CM | POA: Insufficient documentation

## 2023-01-01 DIAGNOSIS — Z23 Encounter for immunization: Secondary | ICD-10-CM | POA: Insufficient documentation

## 2023-01-01 MED ORDER — TETANUS-DIPHTH-ACELL PERTUSSIS 5-2.5-18.5 LF-MCG/0.5 IM SUSY
0.5000 mL | PREFILLED_SYRINGE | Freq: Once | INTRAMUSCULAR | Status: AC
  Administered 2023-01-01: 0.5 mL via INTRAMUSCULAR
  Filled 2023-01-01: qty 0.5

## 2023-01-01 MED ORDER — LIDOCAINE HCL (PF) 1 % IJ SOLN
10.0000 mL | Freq: Once | INTRAMUSCULAR | Status: DC
  Filled 2023-01-01: qty 10

## 2023-01-01 NOTE — ED Provider Triage Note (Signed)
 Emergency Medicine Provider Triage Evaluation Note  CHINO SARDO , a 27 y.o. male  was evaluated in triage.  Pt complains of hand lacerations bilaterally. Patient reluctant to answer questions. Per police, he was jumping fences.  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  Review of Systems  Positive: laceration Negative: weakness  Physical Exam  There were no vitals taken for this visit. Gen:   Awake, no distress   Resp:  Normal effort  MSK:   Moves extremities without difficulty  Other:  Laceration to palm of left hand with dried blood, laceration to pad of finger on right hand. Normal ROM of fingers, normal grip strength  Medical Decision Making  Medically screening exam initiated at 2:18 PM.  Appropriate orders placed.  SAUD BAIL was informed that the remainder of the evaluation will be completed by another provider, this initial triage assessment does not replace that evaluation, and the importance of remaining in the ED until their evaluation is complete.     Eisen Robenson E, PA-C 01/01/23 1424

## 2023-01-01 NOTE — ED Triage Notes (Signed)
Pt here with [employer]. Pt presents with cuts on his hands bilaterally. States he fell. [employer] states he was jumping fences.

## 2023-01-01 NOTE — Discharge Instructions (Addendum)
Keep the wound clean, dry, and covered. See a local provider for suture removal in 10-12 days.

## 2023-01-01 NOTE — ED Provider Notes (Signed)
 [facility] Emergency Department Provider Note     Event Date/Time   First MD Initiated Contact with Patient 01/01/23 1525     (approximate)   History   Laceration   HPI  Steven Brock is a 27 y.o. male presents to the ED in the custody of [employer] [employer], for evaluation of bilateral palmar hand wounds prior to arrest.  Patient attempted to jump fences, and apparently sustained several cuts and abrasions to the palms bilaterally.  He denies any other injury at this time.  Patient is uncooperative throughout his course in the ED.  Physical Exam   Triage Vital Signs: ED Triage Vitals [01/01/23 1423]  Encounter Vitals Group     BP 108/84     Systolic BP Percentile      Diastolic BP Percentile      Pulse Rate (!) 113     Resp 18     Temp 97.6 F (36.4 C)     Temp Source Axillary     SpO2 98 %     Weight      Height      Head Circumference      Peak Flow      Pain Score 3     Pain Loc      Pain Education      Exclude from Growth Chart     Most recent vital signs: Vitals:   01/01/23 1423  BP: 108/84  Pulse: (!) 113  Resp: 18  Temp: 97.6 F (36.4 C)  SpO2: 98%    General Awake, no distress. NAD HEENT NCAT. PERRL. EOMI. No rhinorrhea. Mucous membranes are moist. CV:  Good peripheral perfusion. RRR RESP:  Normal effort. CTA ABD:  No distention.  MSK:  Normal composite fist distally.  The left palm with a 2-1/2 [place] laceration to the thenar prominence.  The right ring finger also noted to have a deep, blood-scabbed wound.     ED Results / Procedures / Treatments   Labs (all labs ordered are listed, but only abnormal results are displayed) Labs Reviewed - No data to display   EKG   RADIOLOGY  No results found.   PROCEDURES:  Critical Care performed: No  .Laceration Repair  Date/Time: 01/01/2023 4:05 PM  Performed by: Loyd Candida LULLA Aldona, PA-C Authorized by: Loyd Candida LULLA Aldona, PA-C    Consent:    Consent obtained:  Verbal   Consent given by:  Patient   Risks, benefits, and alternatives were discussed: yes     Risks discussed:  Infection, pain and poor wound healing   Alternatives discussed:  No treatment Universal protocol:    Site/side marked: yes     Patient identity confirmed:  Verbally with patient Anesthesia:    Anesthesia method:  Local infiltration   Local anesthetic:  Lidocaine  1% w/o epi Laceration details:    Location:  Hand   Hand location:  L palm   Length (cm):  2.5   Depth (mm):  5 Pre-procedure details:    Preparation:  Patient was prepped and draped in usual sterile fashion Exploration:    Limited defect created (wound extended): no     Hemostasis achieved with:  Direct pressure   Contaminated: no   Treatment:    Area cleansed with:  Saline, povidone-iodine and soap and water   Amount of cleaning:  Extensive   Irrigation solution:  Sterile saline   Irrigation method:  Tap   Debridement:  None  Undermining:  None   Scar revision: no   Skin repair:    Repair method:  Sutures   Suture size:  4-0   Suture material:  Nylon   Suture technique:  Simple interrupted   Number of sutures:  3 Approximation:    Approximation:  Close Repair type:    Repair type:  Simple Post-procedure details:    Dressing:  Non-adherent dressing   Procedure completion:  Tolerated well, no immediate complications    MEDICATIONS ORDERED IN ED: Medications  lidocaine  (PF) (XYLOCAINE ) 1 % injection 10 mL (has no administration in time range)  Tdap (BOOSTRIX) injection 0.5 mL (0.5 mLs Intramuscular Given 01/01/23 1540)     IMPRESSION / MDM / ASSESSMENT AND PLAN / ED COURSE  I reviewed the triage vital signs and the nursing notes.                              Differential diagnosis includes, but is not limited to, pulm laceration, abrasion, contusions, puncture wounds  Patient's presentation is most consistent with acute, uncomplicated  illness.  Patient's diagnosis is consistent with hand laceration secondary to injury on a chain link fence.  Patient and [employer] custody presents for medical treatment and clearance.  Patient verbally consents to wound repair, but is intermittently uncooperative during the procedure.  Patient will be discharged home with wound care instructions. Patient is to follow up with [facility] for suture removal in 10 to 12 days as discussed, as needed or otherwise directed. Patient is given ED precautions to return to the ED for any worsening or new symptoms.  Patient is medically cleared for incarceration.  FINAL CLINICAL IMPRESSION(S) / ED DIAGNOSES   Final diagnoses:  Laceration of left hand without foreign body, initial encounter  Medical clearance for incarceration     Rx / DC Orders   ED Discharge Orders     None        Note:  This document was prepared using Dragon voice recognition software and may include unintentional dictation errors.    Loyd Candida LULLA Aldona, PA-C 01/01/23 1647    Ernest Ronal BRAVO, MD 01/01/23 [phone]

## 2023-01-08 ENCOUNTER — Emergency Department
Admission: EM | Admit: 2023-01-08 | Discharge: 2023-01-08 | Discharge status: Against Medical Advice | Payer: Self-pay | Attending: Family Medicine | Admitting: Family Medicine

## 2023-01-08 ENCOUNTER — Encounter: Payer: Self-pay | Admitting: *Deleted

## 2023-01-08 ENCOUNTER — Other Ambulatory Visit: Payer: Self-pay

## 2023-01-08 DIAGNOSIS — L089 Local infection of the skin and subcutaneous tissue, unspecified: Secondary | ICD-10-CM

## 2023-01-08 DIAGNOSIS — Z4802 Encounter for removal of sutures: Secondary | ICD-10-CM | POA: Insufficient documentation

## 2023-01-08 DIAGNOSIS — Z5321 Procedure and treatment not carried out due to patient leaving prior to being seen by health care provider: Secondary | ICD-10-CM | POA: Insufficient documentation

## 2023-01-08 MED ORDER — DOXYCYCLINE HYCLATE 100 MG PO CAPS: 100.0000 mg | ORAL_CAPSULE | Freq: Two times a day (BID) | ORAL | 0 refills | Status: AC

## 2023-01-08 NOTE — ED Triage Notes (Signed)
 Pt has sutures in left hand x 1 week.  Hand infected.  Pt requesting stitches removed and hand wrapped.  Fnp triplett in with pt in triage.  Pt alert  speech clear.

## 2023-01-08 NOTE — Discharge Instructions (Signed)
 Return to the ER if not improving.  Please take the antibiotic as prescribed and until finished.

## 2023-01-19 ENCOUNTER — Emergency Department
Admission: EM | Admit: 2023-01-19 | Discharge: 2023-01-19 | Disposition: A | Discharge status: Home / Self Care | Payer: Self-pay | Attending: Emergency Medicine | Admitting: Emergency Medicine

## 2023-01-19 ENCOUNTER — Telehealth: Payer: Self-pay

## 2023-01-19 ENCOUNTER — Other Ambulatory Visit: Payer: Self-pay

## 2023-01-19 DIAGNOSIS — T490X5A Adverse effect of local antifungal, anti-infective and anti-inflammatory drugs, initial encounter: Secondary | ICD-10-CM | POA: Insufficient documentation

## 2023-01-19 DIAGNOSIS — L853 Xerosis cutis: Secondary | ICD-10-CM | POA: Insufficient documentation

## 2023-01-19 NOTE — ED Provider Notes (Signed)
[facility] Provider Note   Event Date/Time   First MD Initiated Contact with Patient 01/19/23 1445     (approximate) History  Wound Check  HPI Steven Brock is a 28 y.o. male who presents for wound check after a laceration to his hand approximately 2 weeks ago with suture removal and on 01/08/23.  Patient states that he has been using peroxide daily to clean this wound and now has significant dry skin around this area with mild redness.  Patient denies any significant pain.  Patient denies any limiting movement in this hand ROS: Patient currently denies any vision changes, tinnitus, difficulty speaking, facial droop, sore throat, chest pain, shortness of breath, abdominal pain, nausea/vomiting/diarrhea, dysuria, or weakness/numbness/paresthesias in any extremity   Physical Exam  Triage Vital Signs: ED Triage Vitals [01/19/23 1158]  Encounter Vitals Group     BP 134/88     Systolic BP Percentile      Diastolic BP Percentile      Pulse Rate 74     Resp 18     Temp 97.9 F (36.6 C)     Temp Source Oral     SpO2 100 %     Weight 140 lb (63.5 kg)     Height 5\' 8"  (1.727 m)     Head Circumference      Peak Flow      Pain Score 0     Pain Loc      Pain Education      Exclude from Growth Chart    Most recent vital signs: Vitals:   01/19/23 1158  BP: 134/88  Pulse: 74  Resp: 18  Temp: 97.9 F (36.6 C)  SpO2: 100%   General: Awake, oriented x4. CV:  Good peripheral perfusion.  Resp:  Normal effort.  Abd:  No distention.  Other:  Young adult well-developed, well-nourished, African-American male resting comfortably in no acute distress.  There is significant xerosis to the palm of the left hand spreads around to the back portion only to the level of the ring finger ED Results / Procedures / Treatments  Labs (all labs ordered are listed, but only abnormal results are displayed) Labs Reviewed - No data to display PROCEDURES: Critical Care performed:  No Procedures MEDICATIONS ORDERED IN ED: Medications - No data to display IMPRESSION / MDM / ASSESSMENT AND PLAN / ED COURSE  I reviewed the triage vital signs and the nursing notes.                             The patient is on the cardiac monitor to evaluate for evidence of arrhythmia and/or significant heart rate changes. Patient's presentation is most consistent with acute presentation with potential threat to life or bodily function. Patient is a 28 year old male who presents for significant dryness to the left hand following using peroxide for weeks after a laceration Patient is non-toxic appearing and well hydrated. Ddx: Patient's symptoms not typical for other emergent causes of rash such as cellulitis, abscess, necrotizing fasciitis, vasculitis, anaphylaxis, SJS or TENS. Disposition: Patient will be discharged with strict return precautions and follow up with pediatrician within 24-48 hours for further evaluation.   FINAL CLINICAL IMPRESSION(S) / ED DIAGNOSES   Final diagnoses:  Xerosis of skin  Adverse effect of hydrogen peroxide   Rx / DC Orders   ED Discharge Orders     None      Note:  This document was prepared using Dragon voice recognition software and may include unintentional dictation errors.   Merwyn Katos, MD 01/19/23 [phone]

## 2023-01-19 NOTE — Telephone Encounter (Signed)
Patient was last seen in the ED 1/7 at that time prescribed doxycycline,  He did not get it since he feels all medications are bad. There is now a court order/injunction in place to make him take the medication, however it was 75 dollars and is too expensive. Good rx coupon for 27 dollars given to [facility] via phone [phone]

## 2023-01-19 NOTE — Discharge Instructions (Addendum)
Please wash your hands at least 3 times a day Please use a petrolatum based moisturizer (I.e. Eucerin) at least twice a day or more to keep your hand moist

## 2023-01-19 NOTE — ED Triage Notes (Signed)
Pt reports:  Wound Check F/u from 1/7 Has not taken ABX  Personal refusal

## 2023-10-22 ENCOUNTER — Emergency Department
Admission: EM | Admit: 2023-10-22 | Discharge: 2023-10-23 | Disposition: A | Payer: MEDICAID | Attending: Emergency Medicine | Admitting: Emergency Medicine

## 2023-10-22 ENCOUNTER — Other Ambulatory Visit: Payer: Self-pay

## 2023-10-22 ENCOUNTER — Encounter: Payer: Self-pay | Admitting: Emergency Medicine

## 2023-10-22 DIAGNOSIS — F22 Delusional disorders: Secondary | ICD-10-CM | POA: Insufficient documentation

## 2023-10-22 DIAGNOSIS — R440 Auditory hallucinations: Secondary | ICD-10-CM | POA: Diagnosis present

## 2023-10-22 DIAGNOSIS — F23 Brief psychotic disorder: Secondary | ICD-10-CM

## 2023-10-22 DIAGNOSIS — F203 Undifferentiated schizophrenia: Secondary | ICD-10-CM | POA: Insufficient documentation

## 2023-10-22 HISTORY — DX: Attention-deficit hyperactivity disorder, unspecified type: F90.9

## 2023-10-22 HISTORY — DX: Depression, unspecified: F32.A

## 2023-10-22 LAB — CBC
HCT: 48 % (ref 39.0–52.0)
Hemoglobin: 16.6 g/dL (ref 13.0–17.0)
MCH: 29.5 pg (ref 26.0–34.0)
MCHC: 34.6 g/dL (ref 30.0–36.0)
MCV: 85.3 fL (ref 80.0–100.0)
Platelets: 329 K/uL (ref 150–400)
RBC: 5.63 MIL/uL (ref 4.22–5.81)
RDW: 12.5 % (ref 11.5–15.5)
WBC: 6.5 K/uL (ref 4.0–10.5)
nRBC: 0 % (ref 0.0–0.2)

## 2023-10-22 LAB — URINE DRUG SCREEN, QUALITATIVE (ARMC ONLY)
Amphetamines, Ur Screen: NOT DETECTED
Barbiturates, Ur Screen: NOT DETECTED
Benzodiazepine, Ur Scrn: NOT DETECTED
Cannabinoid 50 Ng, Ur ~~LOC~~: POSITIVE — AB
Cocaine Metabolite,Ur ~~LOC~~: NOT DETECTED
MDMA (Ecstasy)Ur Screen: NOT DETECTED
Methadone Scn, Ur: NOT DETECTED
Opiate, Ur Screen: NOT DETECTED
Phencyclidine (PCP) Ur S: NOT DETECTED
Tricyclic, Ur Screen: NOT DETECTED

## 2023-10-22 LAB — COMPREHENSIVE METABOLIC PANEL WITH GFR
ALT: 17 U/L (ref 0–44)
AST: 22 U/L (ref 15–41)
Albumin: 4.5 g/dL (ref 3.5–5.0)
Alkaline Phosphatase: 99 U/L (ref 38–126)
Anion gap: 12 (ref 5–15)
BUN: 12 mg/dL (ref 6–20)
CO2: 27 mmol/L (ref 22–32)
Calcium: 9.5 mg/dL (ref 8.9–10.3)
Chloride: 97 mmol/L — ABNORMAL LOW (ref 98–111)
Creatinine, Ser: 0.87 mg/dL (ref 0.61–1.24)
GFR, Estimated: >60 mL/min (ref >60)
Glucose, Bld: 163 mg/dL — ABNORMAL HIGH (ref 70–99)
Potassium: 3.6 mmol/L (ref 3.5–5.1)
Sodium: 136 mmol/L (ref 135–145)
Total Bilirubin: 1.1 mg/dL (ref 0.0–1.2)
Total Protein: 8.4 g/dL — ABNORMAL HIGH (ref 6.5–8.1)

## 2023-10-22 LAB — ETHANOL: Alcohol, Ethyl (B): <15 mg/dL (ref <15)

## 2023-10-22 NOTE — ED Notes (Signed)
 Pt changed into safety scrubs while this RN and Kat, NT present. Pt belongings collected which include: Phone Charger Lighter Shoes Shirt Pants Socks

## 2023-10-22 NOTE — ED Provider Notes (Signed)
 [facility] Provider Note    Event Date/Time   First MD Initiated Contact with Patient 10/22/23 1952     (approximate)   History   Psychiatric Evaluation   HPI  Steven Brock is a 28 y.o. male who presents to the ED for evaluation of Psychiatric Evaluation   Patient presents to the ED under IVC due to auditory hallucinations and paranoia.   Physical Exam   Triage Vital Signs: ED Triage Vitals  Encounter Vitals Group     BP 10/22/23 1934 (!) 132/92     Girls Systolic BP Percentile --      Girls Diastolic BP Percentile --      Boys Systolic BP Percentile --      Boys Diastolic BP Percentile --      Pulse Rate 10/22/23 1934 97     Resp 10/22/23 1934 18     Temp 10/22/23 1934 98.6 F (37 C)     Temp Source 10/22/23 1934 Oral     SpO2 10/22/23 1934 100 %     Weight --      Height 10/22/23 1935 5' 8 (1.727 m)     Head Circumference --      Peak Flow --      Pain Score 10/22/23 1935 0     Pain Loc --      Pain Education --      Exclude from Growth Chart --     Most recent vital signs: Vitals:   10/22/23 1934  BP: (!) 132/92  Pulse: 97  Resp: 18  Temp: 98.6 F (37 C)  SpO2: 100%    General: Awake, no distress.  CV:  Good peripheral perfusion.  Resp:  Normal effort.  Abd:  No distention.  MSK:  No deformity noted.  Neuro:  No focal deficits appreciated. Other:     ED Results / Procedures / Treatments   Labs (all labs ordered are listed, but only abnormal results are displayed) Labs Reviewed  COMPREHENSIVE METABOLIC PANEL WITH GFR - Abnormal; Notable for the following components:      Result Value   Chloride 97 (*)    Glucose, Bld 163 (*)    Total Protein 8.4 (*)    All other components within normal limits  URINE DRUG SCREEN, QUALITATIVE ([facility] ONLY) - Abnormal; Notable for the following components:   Cannabinoid 50 Ng, Ur [place] POSITIVE (*)    All other components within normal limits  ETHANOL  CBC     EKG   RADIOLOGY   Official radiology report(s): No results found.  PROCEDURES and INTERVENTIONS:  Procedures  Medications - No data to display   IMPRESSION / MDM / ASSESSMENT AND PLAN / ED COURSE  I reviewed the triage vital signs and the nursing notes.  Differential diagnosis includes, but is not limited to, polysubstance abuse, acute withdrawals, overdose  {Patient presents with symptoms of an acute illness or injury that is potentially life-threatening.  Patient presents under IVC due to concerns for paranoia, delusional thinking and auditory hallucinations.  No evidence of medical pathology to preclude psychiatric evaluation or disposition.  Marginal hyperglycemia without acidosis.  Normal CBC, negative ethanol level, cannabis metabolites on UDS.  Awaiting psychiatric evaluation  Clinical Course as of 10/22/23 2342  Tue Oct 22, 2023  2141 Auditory hallucinations and paranoia [DS]    Clinical Course User Index [DS] Claudene Rover, MD     FINAL CLINICAL IMPRESSION(S) / ED DIAGNOSES   Final diagnoses:  Paranoid (HCC)     Rx / DC Orders   ED Discharge Orders     None        Note:  This document was prepared using Dragon voice recognition software and may include unintentional dictation errors.   Claudene Rover, MD 10/22/23 [phone]

## 2023-10-22 NOTE — ED Triage Notes (Addendum)
 Pt arrives w/ [employer]; pt has IVC paperwork that states pt is in state of mind which appears to be delusional and talking to people that are not there.pt's mother is fearful of him when he is in this state, he also believes he is a FBI agent. Denies allegations or SI. Pt cooperative during triage process.

## 2023-10-22 NOTE — BH Assessment (Signed)
 Comprehensive Clinical Assessment (CCA) Screening, Triage and Referral Note  10/22/2023 Steven Brock [id number] Recommendations for Services/Supports/Treatments: Iris consult/Disposition pending. Steven Brock is a 28 y.o., African American, Not Hispanic or Latino ethnicity, ENGLISH speaking male who presented to the ED under IVC for an evaluation. Per triage note: Pt arrives w/ [employer]; pt. has IVC paperwork that states pt. is in state of mind which appears to be delusional and talking to people that are not there. pt.'s mother is fearful of him when he is in this state, he also believes he is a FBI agent.   Chief Complaint: Patient preoccupied with leaving the hospital, stating his brother will be upset upon learning of his admission. Mental Status Examination: On assessment, patient was observed laughing inappropriately. Reality testing appeared distorted, and patient exhibited paranoid delusions. He was fixated on discharge, stating that hospitalization is "prolonging his life," which he identified as his primary stressor. Psychomotor activity was within normal limits. Patient was distractible and visibly anxious throughout the interview. Speech was loose and often irrelevant. As the assessment progressed, patient became increasingly paranoid, expressing concern that he was being "set up" by this Clinical research associate. Mood was described as pleasant, with congruent affect. Eye contact was fair. Patient frequently responded to questions with, "I feel like I'm being haunted by my past." Denied suicidal ideation (SI), homicidal ideation (HI), auditory/visual hallucinations (AV/H). Denied substance use, despite lab results indicating cannabis use. Blood alcohol level (BAL) was unremarkable. Insight and judgment were poor. Patient was unable to articulate the reason for involuntary commitment (IVC) or any concerns his family may have. He reported no current connection to psychiatric  services. Impression: Patient presents with symptoms consistent with psychosis, including paranoid delusions, loose thought processes, and impaired insight. Cannabis use may be contributing to presentation. Continued evaluation and stabilization recommended. Collateral: Pt attempted to contact Robynn Belt (Sister) [phone]. There was no answer/ability to leave a voicemail.  Chief Complaint:  Chief Complaint  Patient presents with   Psychiatric Evaluation   Visit Diagnosis: Undifferentiated Schizophrenia  Patient Reported Information How did you hear about us ? No data recorded What Is the Reason for Your Visit/Call Today? No data recorded How Long Has This Been Causing You Problems? No data recorded What Do You Feel Would Help You the Most Today? No data recorded  Have You Recently Had Any Thoughts About Hurting Yourself? No data recorded Are You Planning to Commit Suicide/Harm Yourself At This time? No data recorded  Have you Recently Had Thoughts About Hurting Someone Sherral? No data recorded Are You Planning to Harm Someone at This Time? No data recorded Explanation: No data recorded  Have You Used Any Alcohol or Drugs in the Past 24 Hours? No data recorded How Long Ago Did You Use Drugs or Alcohol? No data recorded What Did You Use and How Much? No data recorded  Do You Currently Have a Therapist/Psychiatrist? No data recorded Name of Therapist/Psychiatrist: No data recorded  Have You Been Recently Discharged From Any Office Practice or Programs? No data recorded Explanation of Discharge From Practice/Program: No data recorded   CCA Screening Triage Referral Assessment Type of Contact: No data recorded Telemedicine Service Delivery:   Is this Initial or Reassessment?   Date Telepsych consult ordered in [facility]:    Time Telepsych consult ordered in [facility]:    Location of Assessment: No data recorded Provider Location: No data recorded   Collateral Involvement: No data  recorded  Does Patient Have a Automotive engineer  Guardian? No data recorded Name and Contact of Legal Guardian: No data recorded If Minor and Not Living with Parent(s), Who has Custody? No data recorded Is CPS involved or ever been involved? No data recorded Is APS involved or ever been involved? No data recorded  Patient Determined To Be At Risk for Harm To Self or Others Based on Review of Patient Reported Information or Presenting Complaint? No data recorded Method: No data recorded Availability of Means: No data recorded Intent: No data recorded Notification Required: No data recorded Additional Information for Danger to Others Potential: No data recorded Additional Comments for Danger to Others Potential: No data recorded Are There Guns or Other Weapons in Your Home? No data recorded Types of Guns/Weapons: No data recorded Are These Weapons Safely Secured?                            No data recorded Who Could Verify You Are Able To Have These Secured: No data recorded Do You Have any Outstanding Charges, Pending Court Dates, Parole/Probation? No data recorded Contacted To Inform of Risk of Harm To Self or Others: No data recorded  Does Patient Present under Involuntary Commitment? No data recorded   [place] of Residence: No data recorded  Patient Currently Receiving the Following Services: No data recorded  Determination of Need: No data recorded  Options For Referral: No data recorded  Disposition Recommendation per psychiatric provider: Pending Iris consult.  [name], LCAS

## 2023-10-22 NOTE — ED Notes (Signed)
 Pt reporting to ED under IVC. Per pt, unsure of the reason for today's visit. Pt states that he has never been in a situation like today where he has been brought here forcibly. Pt stating that he want's to leave and be out in the world because I just feel like I'd have a better opportunity out there. Pt also reports being afraid of hospitals. Pt denies endorsing any SI/HI/AH/VH. Pt claims that he does not know why the police were called and brought him here. Pt ABCs intact. RR even and unlabored. Pt in NAD. Bed in lowest locked position.   Past Medical History:  Diagnosis Date   ADHD    Depression

## 2023-10-23 DIAGNOSIS — F23 Brief psychotic disorder: Secondary | ICD-10-CM

## 2023-10-23 MED ORDER — ZIPRASIDONE MESYLATE 20 MG IM SOLR: 10.0000 mg | Freq: Four times a day (QID) | INTRAMUSCULAR | Status: DC | PRN

## 2023-10-23 MED ORDER — DIPHENHYDRAMINE HCL 50 MG/ML IJ SOLN: 50.0000 mg | Freq: Four times a day (QID) | INTRAMUSCULAR | Status: DC | PRN

## 2023-10-23 MED ORDER — DIAZEPAM 5 MG/ML IJ SOLN: 10.0000 mg | Freq: Four times a day (QID) | INTRAMUSCULAR | Status: DC | PRN

## 2023-10-23 MED ORDER — HYDROXYZINE HCL 25 MG PO TABS: 50.0000 mg | ORAL_TABLET | Freq: Four times a day (QID) | ORAL | Status: DC | PRN

## 2023-10-23 NOTE — ED Provider Notes (Signed)
 Emergency Medicine Observation Re-evaluation Note  Steven Brock is a 28 y.o. male, seen on rounds today.  Pt initially presented to the ED for complaints of Psychiatric Evaluation  Currently, the patient is is no acute distress. Denies any concerns at this time.  Physical Exam  Blood pressure (!) 132/92, pulse 97, temperature 98.6 F (37 C), temperature source Oral, resp. rate 18, height 5' 8 (1.727 m), SpO2 100%.  Physical Exam: General: No apparent distress Pulm: Normal WOB Neuro: Moving all extremities Psych: Resting comfortably     ED Course / MDM   Clinical Course as of 10/23/23 0340  Tue Oct 22, 2023  2141 Auditory hallucinations and paranoia [DS]    Clinical Course User Index [DS] Claudene Rover, MD    I have reviewed the labs performed to date as well as medications administered while in observation.  Recent changes in the last 24 hours include: No acute events overnight.  Plan   Current plan: Patient awaiting psychiatric disposition.   Patient under IVC.  He is accepted to [facility] after 8 AM by Dr. Oneil Charleston .   Lesta Limbert, [place], [place] 10/23/23 [phone]

## 2023-10-23 NOTE — ED Notes (Signed)
 Patient is resting comfortably.

## 2023-10-23 NOTE — BH Assessment (Signed)
 Patient has been accepted to [facility].  Accepting physician is Dr. Oneil Charleston.  Call report to [phone].  Representative was Raven.   ER Staff is aware of it:  [place], ER Secretary  Dr. Neomi, ER MD  Leonidas, Patient's Nurse     Patient can arrive at facility 10/23/23 anytime after 8 AM.

## 2023-10-23 NOTE — ED Notes (Signed)
 IVC, Inpt/Pending Consult

## 2023-10-23 NOTE — ED Notes (Signed)
 IVC/ Accepted at [facility] after 8am

## 2023-10-23 NOTE — BH Assessment (Signed)
 Destination  Service Provider Request Status Services Address Phone Fax Patient Preferred  [facility] Patient Placement  Pending - Request Sent -- [facility], [place] [phone] --  [facility]  Pending - Request Sent -- [address], [place] [address] [phone] --  [facility]  Pending - Request Sent -- [address] [place], [place] [address] [phone] --  [facility]  Pending - Request Sent -- [address] [place] [address] [phone] --  [facility]  Pending - Request Sent -- [address], [place] [address] [phone] --  [facility]  Pending - Request Sent -- [address] [place] [address] [phone] --  [facility]  Pending - Request Sent -- [address] [place] [address] [phone] --  [facility]  Pending - Request Sent -- [place] [phone] --  [facility]  Pending - Request Sent -- [address] [place] [address] [phone] --  [facility]  Pending - Request Sent -- [address], [place] [address] [phone] --  [facility]  Pending - Request Sent -- [address], [place] [address] [phone] --  [facility]  Pending - Request Sent -- [place] [phone] --  [facility]  Pending - Request Sent -- [address] [place], [place] [address] [phone] --  [facility]  Pending - Request Sent -- [address] [place] [address] [phone] --

## 2023-10-23 NOTE — ED Notes (Signed)
 Unsuccessful attempt to give report to [facility]

## 2023-10-23 NOTE — ED Notes (Addendum)
 Raven from [facility], called to receive report on pt. Pt will be accepted there for inpatient admission.

## 2023-10-23 NOTE — Consult Note (Signed)
 [facility] Consult Note  Patient Name: Steven Brock MRN: [id number] DOB: 1995-12-05 DATE OF Consult: 10/23/2023  PRIMARY PSYCHIATRIC DIAGNOSES   1.  Brief Reactive Psychosis   RECOMMENDATIONS  Recommendations: Medication recommendations: Do not recommend starting any scheduled psychotropic medication in ED.  PRN's:  for anxiety, hydroxyzine, 50 mg q6h PRN; for severe agitation/aggression, Geodon, 10 mg IM q6h PRN and Valium 10 mg IM q6h PRN and Benadryl 50 mg IM q6h PRN. Non-Medication/therapeutic recommendations: Patient's thinking is quite disorganized, and he is unable even to communicate basic information about himself.  He will require close observation, including elopement precautions, until he can safely be admitted to a psychiatric facility. Is inpatient psychiatric hospitalization recommended for this patient? Yes (Explain why): Patient simply could not engage in any conversation except to repeat again and again that I need to get out of here.  He said this with a smile, and he appeared to have no recognition that he is in the ED on an IVC and that he would only help himself if he were to answer basic questions about what has been happening and what he is experiencing.  Given the behavior alleged in the IVC complaint, which is quite consistent with his current mental status, and his affect, which easily could be because of a mood disorder (or some type of substance use), patient meets IVC criteria for mental illness and grave disability, and he needs acute hospitalization for safety an treatment. Is another care setting recommended for this patient? (examples may include Crisis Stabilization Unit, Residential/Recovery Treatment, ALF/SNF, Memory Care Unit)  No (Explain why): As above From a psychiatric perspective, is this patient appropriate for discharge to an outpatient setting/resource or other less restrictive environment for continued care?  No (Explain why): As  above Follow-Up Telepsychiatry C/L services: We will sign off for now. Please re-consult our service if needed for any concerning changes in the patient's condition, discharge planning, or questions. Communication: Treatment team members (and family members if applicable) who were involved in treatment/care discussions and planning, and with whom we spoke or engaged with via secure text/chat, include the following: Secure message sent to Dr. Neomi, ED attending, and ED staff, outlining above recommendations.  Thank you for involving us  in the care of this patient. If you have any additional questions or concerns, please call [phone] and ask for the provider on-call.   TELEPSYCHIATRY ATTESTATION & CONSENT  As the provider for this telehealth consult, I attest that I verified the patient's identity using two separate identifiers, introduced myself to the patient, provided my credentials, disclosed my location, and performed this encounter via a HIPAA-compliant, real-time, face-to-face, two-way, interactive audio and video platform and with the full consent and agreement of the patient (or guardian as applicable.)  Patient physical location: ED at [facility]. Telehealth provider physical location: home office in state of [place] .  Video start time: 0205h EDT  Video end time: 2020h  EDT   Total time spent in this encounter was 30 minutes, including record review, clinical interview, behavior observations, discussion of impressions and recommendations (including medications and hospitalization), and consultation/communication with relevant parties   IDENTIFYING DATA  Steven Brock is a 28 y.o. year-old male for whom a psychiatric consultation has been ordered by the primary provider. The patient was identified using two separate identifiers.  CHIEF COMPLAINT/REASON FOR CONSULT   I'm fine, and I'm ready to go home.  And you've just told me that I can go home.   HISTORY  OF PRESENT ILLNESS  (HPI)   The patient presents with acutely disorganized thinking.  Initially said that is fine and he wanted me to tell him what happened.  He finally did state that the police came because his mother thought his music was too loud, but could not give any other history, and could remain consistent that his mother had called police.  He simply perseverated in saying I'm ready to go home.  And when I told him of my concern that I would not be recommending that he go home, he simply repeated again and again, Yes, thank you.  I'll be going home now.  Patient came in on IVC papers, which alleged that he has been talking to internal stimuli and thinking that he is an [employer] agent.  It stated that his mother has been afraid of him.  No other history available at this time (patient has a few ED admits, and during one of them, several years, he refused to speak to anybody, but was released home).  Patient has consistently been unable to give a coherent history to staff, and even has alleged that people are trying to hurt him.  Patient has not stated any desire to harm himself.  Unclear what he may be thinking about others, given that his mother is afraid of him.  Has at times appeared to be responding to auditory hallucinations.  BAL negative.  UDS positive for cannabis.   PAST PSYCHIATRIC HISTORY  As above Otherwise as per HPI above.  PAST MEDICAL HISTORY  Past Medical History:  Diagnosis Date   ADHD    Depression      HOME MEDICATIONS  Facility Ordered Medications  Medication   hydrOXYzine (ATARAX) tablet 50 mg   ziprasidone (GEODON) injection 10 mg   diazepam (VALIUM) injection 10 mg   diphenhydrAMINE (BENADRYL) injection 50 mg   PTA Medications  Medication Sig   naproxen  (NAPROSYN ) 500 MG tablet Take 1 tablet (500 mg total) by mouth 2 (two) times daily with a meal. (Patient not taking: Reported on 10/22/2023)   ibuprofen  (ADVIL ) 600 MG tablet Take 1 tablet (600 mg total) by mouth every 8  (eight) hours as needed. (Patient not taking: Reported on 10/22/2023)   HYDROcodone -acetaminophen  (NORCO) 5-325 MG tablet Take 1 tablet by mouth every 6 (six) hours as needed for moderate pain. (Patient not taking: Reported on 10/22/2023)   doxycycline  (VIBRAMYCIN ) 100 MG capsule Take 1 capsule (100 mg total) by mouth 2 (two) times daily. (Patient not taking: Reported on 10/22/2023)   Unknown  ALLERGIES  Allergies  Allergen Reactions   Bee Venom Anaphylaxis   Strawberry Extract Rash    SOCIAL & SUBSTANCE USE HISTORY  Social History   Socioeconomic History   Marital status: Single    Spouse name: Not on file   Number of children: Not on file   Years of education: Not on file   Highest education level: Not on file  Occupational History   Not on file  Tobacco Use   Smoking status: Some Days   Smokeless tobacco: Never  Vaping Use   Vaping status: Not on file  Substance and Sexual Activity   Alcohol use: No   Drug use: Not Currently    Types: Marijuana   Sexual activity: Not on file  Other Topics Concern   Not on file  Social History Narrative   Not on file   Social Drivers of Health   Financial Resource Strain: Not on file  Food Insecurity:  Not on file  Transportation Needs: Not on file  Physical Activity: Not on file  Stress: Not on file  Social Connections: Not on file   Social History   Tobacco Use  Smoking Status Some Days  Smokeless Tobacco Never   Social History   Substance and Sexual Activity  Alcohol Use No   Social History   Substance and Sexual Activity  Drug Use Not Currently   Types: Marijuana    Additional pertinent information Attempted to contact sister, but no voice mail  Attempted number marked home, and may be his mother Jethro), but no response. SABRA  FAMILY HISTORY  History reviewed. No pertinent family history. Family Psychiatric History (if known):  Unknown  MENTAL STATUS EXAM (MSE)  Mental Status Exam: General Appearance:  Fairly Groomed  Orientation:  Full (Time, Place, and Person)  Memory:  Unable to assess, given level of psychosis  Concentration:  Concentration: Poor and Attention Span: Poor  Recall:  Unable to assess, given level of psychosis  Attention  Poor  Eye Contact:  Minimal  Speech:  Pressured  Language:  Good  Volume:  Normal  Mood: I'm ready to go home now  Affect:  Inappropriate and Labile  Thought Process:  Disorganized and Descriptions of Associations: Loose  Thought Content:  Hallucinations: Auditory, Ideas of Reference:   Paranoia Delusions, Paranoid Ideation, and Tangential  Suicidal Thoughts:  Patient did not give indication of wanting to die, and was forward-looking, in that wanted to leave the hospital  Homicidal Thoughts:  Unable to assess, given level of psychosis, IVC paperwork states that has frightened his mother  Judgement:  Poor  Insight:  Lacking  Psychomotor Activity:  Increased  Akathisia:  NA  Fund of Knowledge:  Unable to assess, given level of psychosis    Assets:  Others:  Unable to assess, given level of psychosis  Cognition:  WNL  ADL's:  Impaired  AIMS (if indicated):       VITALS  Blood pressure (!) 132/92, pulse 97, temperature 98.6 F (37 C), temperature source Oral, resp. rate 18, height 5' 8 (1.727 m), SpO2 100%.  LABS  Admission on 10/22/2023  Component Date Value Ref Range Status   Sodium 10/22/2023 136  135 - 145 mmol/L Final   Potassium 10/22/2023 3.6  3.5 - 5.1 mmol/L Final   Chloride 10/22/2023 97 (L)  98 - 111 mmol/L Final   CO2 10/22/2023 27  22 - 32 mmol/L Final   Glucose, Bld 10/22/2023 163 (H)  70 - 99 mg/dL Final   Glucose reference range applies only to samples taken after fasting for at least 8 hours.   BUN 10/22/2023 12  6 - 20 mg/dL Final   Creatinine, Ser 10/22/2023 0.87  0.61 - 1.24 mg/dL Final   Calcium 89/78/7974 9.5  8.9 - 10.3 mg/dL Final   Total Protein 89/78/7974 8.4 (H)  6.5 - 8.1 g/dL Final   Albumin 89/78/7974 4.5  3.5  - 5.0 g/dL Final   AST 89/78/7974 22  15 - 41 U/L Final   ALT 10/22/2023 17  0 - 44 U/L Final   Alkaline Phosphatase 10/22/2023 99  38 - 126 U/L Final   Total Bilirubin 10/22/2023 1.1  0.0 - 1.2 mg/dL Final   GFR, Estimated 10/22/2023 >60  >60 mL/min Final   Comment: (NOTE) Calculated using the CKD-EPI Creatinine Equation (2021)    Anion gap 10/22/2023 12  5 - 15 Final   Performed at [facility], [address] [place], [place] [address]   Alcohol, Ethyl (B) 10/22/2023 <15  <15 mg/dL Final   Comment: (NOTE) For medical purposes only. Performed at [facility], [address] [place], [place] [address]    WBC 10/22/2023 6.5  4.0 - 10.5 K/uL Final   RBC 10/22/2023 5.63  4.22 - 5.81 MIL/uL Final   Hemoglobin 10/22/2023 16.6  13.0 - 17.0 g/dL Final   HCT 89/78/7974 48.0  39.0 - 52.0 % Final   MCV 10/22/2023 85.3  80.0 - 100.0 fL Final   MCH 10/22/2023 29.5  26.0 - 34.0 pg Final   MCHC 10/22/2023 34.6  30.0 - 36.0 g/dL Final   RDW 89/78/7974 12.5  11.5 - 15.5 % Final   Platelets 10/22/2023 329  150 - 400 K/uL Final   nRBC 10/22/2023 0.0  0.0 - 0.2 % Final   Performed at [facility], [address] [place], [place] [address]   Tricyclic, Ur Screen 10/22/2023 NONE DETECTED  NONE DETECTED Final   Amphetamines, Ur Screen 10/22/2023 NONE DETECTED  NONE DETECTED Final   MDMA (Ecstasy)Ur Screen 10/22/2023 NONE DETECTED  NONE DETECTED Final   Cocaine Metabolite,Ur Center 10/22/2023 NONE DETECTED  NONE DETECTED Final   Opiate, Ur Screen 10/22/2023 NONE DETECTED  NONE DETECTED Final   Phencyclidine (PCP) Ur S 10/22/2023 NONE DETECTED  NONE DETECTED Final   Cannabinoid 50 Ng, Ur [place] 10/22/2023 POSITIVE (A)  NONE DETECTED Final   Barbiturates, Ur Screen 10/22/2023 NONE DETECTED  NONE DETECTED Final   Benzodiazepine, Ur Scrn 10/22/2023 NONE DETECTED  NONE DETECTED Final   Methadone Scn, Ur 10/22/2023 NONE DETECTED  NONE DETECTED Final   Comment: (NOTE) Tricyclics +  metabolites, urine    Cutoff 1000 ng/mL Amphetamines + metabolites, urine  Cutoff 1000 ng/mL MDMA (Ecstasy), urine              Cutoff 500 ng/mL Cocaine Metabolite, urine          Cutoff 300 ng/mL Opiate + metabolites, urine        Cutoff 300 ng/mL Phencyclidine (PCP), urine         Cutoff 25 ng/mL Cannabinoid, urine                 Cutoff 50 ng/mL Barbiturates + metabolites, urine  Cutoff 200 ng/mL Benzodiazepine, urine              Cutoff 200 ng/mL Methadone, urine                   Cutoff 300 ng/mL  The urine drug screen provides only a preliminary, unconfirmed analytical test result and should not be used for non-medical purposes. Clinical consideration and professional judgment should be applied to any positive drug screen result due to possible interfering substances. A more specific alternate chemical method must be used in order to obtain a confirmed analytical result. Gas chromatography / mass spectrometry (GC/MS) is the preferred confirm                          atory method. Performed at [facility], [address] [place], [place] [address]     PSYCHIATRIC REVIEW OF SYSTEMS (ROS)  ROS: Notable for the following relevant positive findings: Review of Systems  Constitutional: Negative.   HENT: Negative.    Eyes: Negative.   Respiratory: Negative.    Cardiovascular: Negative.   Gastrointestinal: Negative.   Genitourinary: Negative.   Musculoskeletal: Negative.  Skin: Negative.   Neurological: Negative.   Endo/Heme/Allergies: Negative.   Psychiatric/Behavioral:  Positive for hallucinations. The patient is nervous/anxious.     Additional findings:      Musculoskeletal: No abnormal movements observed      Gait & Station: Laying/Sitting      Pain Screening: Denies      Nutrition & Dental Concerns: Reviewed  RISK FORMULATION/ASSESSMENT  Is the patient experiencing any suicidal or homicidal ideations: No       Explain if yes:   However, patient's  thinking is quite disorganized, and he cannot answer basic questions about himself, with persevative language and incongruent affect.  At this point, given his level of disorganization, he is gravely disabled and in need of emergent treatment for safety  Protective factors considered for safety management:   Unable to get any information about life situation, and unable to contact persons in emergency contact.    Risk factors/concerns considered for safety management:  Substance abuse/dependence Impulsivity Unwillingness to seek help Male gender Unmarried  Is there a safety management plan with the patient and treatment team to minimize risk factors and promote protective factors: No           Explain:   As above, patient unable to engage in coherent conversation because of ruminative statements, and no indication that has social supports to manage him safely.  Is crisis care placement or psychiatric hospitalization recommended: Yes     Based on my current evaluation and risk assessment, patient is determined at this time to be at:  High risk  *RISK ASSESSMENT Risk assessment is a dynamic process; it is possible that this patient's condition, and risk level, may change. This should be re-evaluated and managed over time as appropriate. Please re-consult psychiatric consult services if additional assistance is needed in terms of risk assessment and management. If your team decides to discharge this patient, please advise the patient how to best access emergency psychiatric services, or to call 911, if their condition worsens or they feel unsafe in any way.   Adriana JINNY Pontes, MD Telepsychiatry Consult Services

## 2023-10-23 NOTE — ED Notes (Signed)
 [place]  [employer]  called  for  transport  to  [facility]
